# Patient Record
Sex: Male | Born: 1995 | Race: White | Hispanic: No | Marital: Single | Smoking: Current every day smoker
Health system: Southern US, Community
[De-identification: ages and names within clinical notes are randomized; demographics above are authoritative.]

## PROBLEM LIST (undated history)

## (undated) ENCOUNTER — Emergency Department (HOSPITAL_COMMUNITY): Admission: EM | Payer: Managed Care, Other (non HMO) | Source: Home / Self Care

## (undated) DIAGNOSIS — J45909 Unspecified asthma, uncomplicated: Secondary | ICD-10-CM

## (undated) HISTORY — PX: HERNIA REPAIR: SHX51

---
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
## 2014-02-05 ENCOUNTER — Ambulatory Visit (INDEPENDENT_AMBULATORY_CARE_PROVIDER_SITE_OTHER): Payer: BC Managed Care – PPO | Admitting: Internal Medicine

## 2014-02-05 ENCOUNTER — Ambulatory Visit (INDEPENDENT_AMBULATORY_CARE_PROVIDER_SITE_OTHER): Payer: BC Managed Care – PPO

## 2014-02-05 VITALS — BP 140/80 | HR 94 | Temp 98.1°F | Resp 16 | Ht 66.5 in | Wt 175.4 lb

## 2014-02-05 DIAGNOSIS — R079 Chest pain, unspecified: Secondary | ICD-10-CM

## 2014-02-05 DIAGNOSIS — R059 Cough, unspecified: Secondary | ICD-10-CM

## 2014-02-05 DIAGNOSIS — R05 Cough: Secondary | ICD-10-CM

## 2014-02-05 DIAGNOSIS — J45990 Exercise induced bronchospasm: Secondary | ICD-10-CM

## 2014-02-05 DIAGNOSIS — R0602 Shortness of breath: Secondary | ICD-10-CM

## 2014-02-05 LAB — POCT CBC
GRANULOCYTE PERCENT: 67.6 % (ref 37–80)
HEMATOCRIT: 48.7 % (ref 43.5–53.7)
Hemoglobin: 15.8 g/dL (ref 14.1–18.1)
Lymph, poc: 2.1 (ref 0.6–3.4)
MCH: 28.2 pg (ref 27–31.2)
MCHC: 32.5 g/dL (ref 31.8–35.4)
MCV: 86.6 fL (ref 80–97)
MID (CBC): 0.6 (ref 0–0.9)
MPV: 7.7 fL (ref 0–99.8)
POC Granulocyte: 5.6 (ref 2–6.9)
POC LYMPH %: 25.4 % (ref 10–50)
POC MID %: 7 %M (ref 0–12)
Platelet Count, POC: 268 10*3/uL (ref 142–424)
RBC: 5.62 M/uL (ref 4.69–6.13)
RDW, POC: 12.1 %
WBC: 8.3 10*3/uL (ref 4.6–10.2)

## 2014-02-05 MED ORDER — ALBUTEROL SULFATE HFA 108 (90 BASE) MCG/ACT IN AERS: 2.0000 | INHALATION_SPRAY | Freq: Four times a day (QID) | RESPIRATORY_TRACT | Status: DC | PRN

## 2014-02-05 MED ORDER — IBUPROFEN 600 MG PO TABS: 600.0000 mg | ORAL_TABLET | Freq: Three times a day (TID) | ORAL | Status: DC | PRN

## 2014-02-05 NOTE — Progress Notes (Signed)
   Subjective:    Patient ID: David Stark, male    DOB: 12/03/1995, 18 y.o.   MRN: [id number]  HPI 18 year old male. Pt comes in today with complaints of chest and back pain for the last month. Pt states that he cant take a full breath without pain. Pt also has sob. No cough, fever,nausea, vomiting,or chills. Pt smokes but doesn't drink. No hx palpitations or syncope. Father has an electrical problem with his heart. No cough, sneezing, and no hx of allergys. Has a cat, no use of down and has allergy sxs hen around cat. Does feel chest congestion but no sputum and no wheezing and has not used albuterol in haler in weeks. Did not use it when sob this am. Hx asthma no allergys, EIA Review of Systems Father has prolonged QT syndrome working with cardiology    Objective:   Physical Exam  Constitutional: He is oriented to person, place, and time. He appears well-developed and well-nourished. No distress.  HENT:  Head: Normocephalic.  Right Ear: External ear normal.  Left Ear: External ear normal.  Nose: Nose normal.  Mouth/Throat: Oropharynx is clear and moist.  Eyes: Conjunctivae are normal. Pupils are equal, round, and reactive to light.  Neck: Normal range of motion. Neck supple.  Cardiovascular: Normal rate, regular rhythm, normal heart sounds and intact distal pulses.  Exam reveals no gallop and no friction rub.   No murmur heard. Pulmonary/Chest: Effort normal and breath sounds normal. No respiratory distress. He has no wheezes. He has no rales. He exhibits tenderness.  Neurological: He is alert and oriented to person, place, and time. He exhibits normal muscle tone. Coordination normal.  Psychiatric: His speech is normal and behavior is normal. Thought content normal. His mood appears anxious. Cognition and memory are normal.   EKG normal [facility] reading (PRIMARY) by  Dr Perrin Maltese normal cxr no pneumothorax seen  PFR 650 L/min above normal  Results for orders placed in visit on  02/05/14  POCT CBC      Result Value Ref Range   WBC 8.3  4.6 - 10.2 K/uL   Lymph, poc 2.1  0.6 - 3.4   POC LYMPH PERCENT 25.4  10 - 50 %L   MID (cbc) 0.6  0 - 0.9   POC MID % 7.0  0 - 12 %M   POC Granulocyte 5.6  2 - 6.9   Granulocyte percent 67.6  37 - 80 %G   RBC 5.62  4.69 - 6.13 M/uL   Hemoglobin 15.8  14.1 - 18.1 g/dL   HCT, POC 78.2  95.6 - 53.7 %   MCV 86.6  80 - 97 fL   MCH, POC 28.2  27 - 31.2 pg   MCHC 32.5  31.8 - 35.4 g/dL   RDW, POC 21.3     Platelet Count, POC 268  142 - 424 K/uL   MPV 7.7  0 - 99.8 fL         Assessment & Plan:  SOB/CP cause unclear Albuterol/Cat avoidance Trial motrin RTC 3d if not better

## 2014-02-05 NOTE — Patient Instructions (Addendum)
Smoking Cessation Quitting smoking is important to your health and has many advantages. However, it is not always easy to quit since nicotine is a very addictive drug. Oftentimes, people try 3 times or more before being able to quit. This document explains the best ways for you to prepare to quit smoking. Quitting takes hard work and a lot of effort, but you can do it. ADVANTAGES OF QUITTING SMOKING  You will live longer, feel better, and live better.  Your body will feel the impact of quitting smoking almost immediately.  Within 20 minutes, blood pressure decreases. Your pulse returns to its normal level.  After 8 hours, carbon monoxide levels in the blood return to normal. Your oxygen level increases.  After 24 hours, the chance of having a heart attack starts to decrease. Your breath, hair, and body stop smelling like smoke.  After 48 hours, damaged nerve endings begin to recover. Your sense of taste and smell improve.  After 72 hours, the body is virtually free of nicotine. Your bronchial tubes relax and breathing becomes easier.  After 2 to 12 weeks, lungs can hold more air. Exercise becomes easier and circulation improves.  The risk of having a heart attack, stroke, cancer, or lung disease is greatly reduced.  After 1 year, the risk of coronary heart disease is cut in half.  After 5 years, the risk of stroke falls to the same as a nonsmoker.  After 10 years, the risk of lung cancer is cut in half and the risk of other cancers decreases significantly.  After 15 years, the risk of coronary heart disease drops, usually to the level of a nonsmoker.  If you are pregnant, quitting smoking will improve your chances of having a healthy baby.  The people you live with, especially any children, will be healthier.  You will have extra money to spend on things other than cigarettes. QUESTIONS TO THINK ABOUT BEFORE ATTEMPTING TO QUIT You may want to talk about your answers with your  health care provider.  Why do you want to quit?  If you tried to quit in the past, what helped and what did not?  What will be the most difficult situations for you after you quit? How will you plan to handle them?  Who can help you through the tough times? Your family? Friends? A health care provider?  What pleasures do you get from smoking? What ways can you still get pleasure if you quit? Here are some questions to ask your health care provider:  How can you help me to be successful at quitting?  What medicine do you think would be best for me and how should I take it?  What should I do if I need more help?  What is smoking withdrawal like? How can I get information on withdrawal? GET READY  Set a quit date.  Change your environment by getting rid of all cigarettes, ashtrays, matches, and lighters in your home, car, or work. Do not let people smoke in your home.  Review your past attempts to quit. Think about what worked and what did not. GET SUPPORT AND ENCOURAGEMENT You have a better chance of being successful if you have help. You can get support in many ways.  Tell your family, friends, and coworkers that you are going to quit and need their support. Ask them not to smoke around you.  Get individual, group, or telephone counseling and support. Programs are available at local hospitals and health centers. Call   your local health department for information about programs in your area.  Spiritual beliefs and practices may help some smokers quit.  Download a "quit meter" on your computer to keep track of quit statistics, such as how long you have gone without smoking, cigarettes not smoked, and money saved.  Get a self-help book about quitting smoking and staying off tobacco. LEARN NEW SKILLS AND BEHAVIORS  Distract yourself from urges to smoke. Talk to someone, go for a walk, or occupy your time with a task.  Change your normal routine. Take a different route to work.  Drink tea instead of coffee. Eat breakfast in a different place.  Reduce your stress. Take a hot bath, exercise, or read a book.  Plan something enjoyable to do every day. Reward yourself for not smoking.  Explore interactive web-based programs that specialize in helping you quit. GET MEDICINE AND USE IT CORRECTLY Medicines can help you stop smoking and decrease the urge to smoke. Combining medicine with the above behavioral methods and support can greatly increase your chances of successfully quitting smoking.  Nicotine replacement therapy helps deliver nicotine to your body without the negative effects and risks of smoking. Nicotine replacement therapy includes nicotine gum, lozenges, inhalers, nasal sprays, and skin patches. Some may be available over-the-counter and others require a prescription.  Antidepressant medicine helps people abstain from smoking, but how this works is unknown. This medicine is available by prescription.  Nicotinic receptor partial agonist medicine simulates the effect of nicotine in your brain. This medicine is available by prescription. Ask your health care provider for advice about which medicines to use and how to use them based on your health history. Your health care provider will tell you what side effects to look out for if you choose to be on a medicine or therapy. Carefully read the information on the package. Do not use any other product containing nicotine while using a nicotine replacement product.  RELAPSE OR DIFFICULT SITUATIONS Most relapses occur within the first 3 months after quitting. Do not be discouraged if you start smoking again. Remember, most people try several times before finally quitting. You may have symptoms of withdrawal because your body is used to nicotine. You may crave cigarettes, be irritable, feel very hungry, cough often, get headaches, or have difficulty concentrating. The withdrawal symptoms are only temporary. They are strongest  when you first quit, but they will go away within 10-14 days. To reduce the chances of relapse, try to:  Avoid drinking alcohol. Drinking lowers your chances of successfully quitting.  Reduce the amount of caffeine you consume. Once you quit smoking, the amount of caffeine in your body increases and can give you symptoms, such as a rapid heartbeat, sweating, and anxiety.  Avoid smokers because they can make you want to smoke.  Do not let weight gain distract you. Many smokers will gain weight when they quit, usually less than 10 pounds. Eat a healthy diet and stay active. You can always lose the weight gained after you quit.  Find ways to improve your mood other than smoking. FOR MORE INFORMATION  www.smokefree.gov  Document Released: 04/27/2001 Document Revised: 09/17/2013 Document Reviewed: 08/12/2011 [facility] Patient Information 2015 [place], [place]. This information is not intended to replace advice given to you by your health care provider. Make sure you discuss any questions you have with your health care provider. Chest Pain (Nonspecific) It is often hard to give a specific diagnosis for the cause of chest pain. There is always a  chance that your pain could be related to something serious, such as a heart attack or a blood clot in the lungs. You need to follow up with your health care provider for further evaluation. CAUSES   Heartburn.  Pneumonia or bronchitis.  Anxiety or stress.  Inflammation around your heart (pericarditis) or lung (pleuritis or pleurisy).  A blood clot in the lung.  A collapsed lung (pneumothorax). It can develop suddenly on its own (spontaneous pneumothorax) or from trauma to the chest.  Shingles infection (herpes zoster virus). The chest wall is composed of bones, muscles, and cartilage. Any of these can be the source of the pain.  The bones can be bruised by injury.  The muscles or cartilage can be strained by coughing or overwork.  The cartilage  can be affected by inflammation and become sore (costochondritis). DIAGNOSIS  Lab tests or other studies may be needed to find the cause of your pain. Your health care provider may have you take a test called an ambulatory electrocardiogram (ECG). An ECG records your heartbeat patterns over a 24-hour period. You may also have other tests, such as:  Transthoracic echocardiogram (TTE). During echocardiography, sound waves are used to evaluate how blood flows through your heart.  Transesophageal echocardiogram (TEE).  Cardiac monitoring. This allows your health care provider to monitor your heart rate and rhythm in real time.  Holter monitor. This is a portable device that records your heartbeat and can help diagnose heart arrhythmias. It allows your health care provider to track your heart activity for several days, if needed.  Stress tests by exercise or by giving medicine that makes the heart beat faster. TREATMENT   Treatment depends on what may be causing your chest pain. Treatment may include:  Acid blockers for heartburn.  Anti-inflammatory medicine.  Pain medicine for inflammatory conditions.  Antibiotics if an infection is present.  You may be advised to change lifestyle habits. This includes stopping smoking and avoiding alcohol, caffeine, and chocolate.  You may be advised to keep your head raised (elevated) when sleeping. This reduces the chance of acid going backward from your stomach into your esophagus. Most of the time, nonspecific chest pain will improve within 2-3 days with rest and mild pain medicine.  HOME CARE INSTRUCTIONS   If antibiotics were prescribed, take them as directed. Finish them even if you start to feel better.  For the next few days, avoid physical activities that bring on chest pain. Continue physical activities as directed.  Do not use any tobacco products, including cigarettes, chewing tobacco, or electronic cigarettes.  Avoid drinking  alcohol.  Only take medicine as directed by your health care provider.  Follow your health care provider's suggestions for further testing if your chest pain does not go away.  Keep any follow-up appointments you made. If you do not go to an appointment, you could develop lasting (chronic) problems with pain. If there is any problem keeping an appointment, call to reschedule. SEEK MEDICAL CARE IF:   Your chest pain does not go away, even after treatment.  You have a rash with blisters on your chest.  You have a fever. SEEK IMMEDIATE MEDICAL CARE IF:   You have increased chest pain or pain that spreads to your arm, neck, jaw, back, or abdomen.  You have shortness of breath.  You have an increasing cough, or you cough up blood.  You have severe back or abdominal pain.  You feel nauseous or vomit.  You have severe weakness.  You faint.  You have chills. This is an emergency. Do not wait to see if the pain will go away. Get medical help at once. Call your local emergency services (911 in [place] Do not drive yourself to the hospital. MAKE SURE YOU:   Understand these instructions.  Will watch your condition.  Will get help right away if you are not doing well or get worse. Document Released: 02/10/2005 Document Revised: 05/08/2013 Document Reviewed: 12/07/2007 [facility] Patient Information 2015 [place], [place]. This information is not intended to replace advice given to you by your health care provider. Make sure you discuss any questions you have with your health care provider. Asthma Attack Prevention Although there is no way to prevent asthma from starting, you can take steps to control the disease and reduce its symptoms. Learn about your asthma and how to control it. Take an active role to control your asthma by working with your health care provider to create and follow an asthma action plan. An asthma action plan guides you in:  Taking your medicines properly.  Avoiding  things that set off your asthma or make your asthma worse (asthma triggers).  Tracking your level of asthma control.  Responding to worsening asthma.  Seeking emergency care when needed. To track your asthma, keep records of your symptoms, check your peak flow number using a handheld device that shows how well air moves out of your lungs (peak flow meter), and get regular asthma checkups.  WHAT ARE SOME WAYS TO PREVENT AN ASTHMA ATTACK?  Take medicines as directed by your health care provider.  Keep track of your asthma symptoms and level of control.  With your health care provider, write a detailed plan for taking medicines and managing an asthma attack. Then be sure to follow your action plan. Asthma is an ongoing condition that needs regular monitoring and treatment.  Identify and avoid asthma triggers. Many outdoor allergens and irritants (such as pollen, mold, cold air, and air pollution) can trigger asthma attacks. Find out what your asthma triggers are and take steps to avoid them.  Monitor your breathing. Learn to recognize warning signs of an attack, such as coughing, wheezing, or shortness of breath. Your lung function may decrease before you notice any signs or symptoms, so regularly measure and record your peak airflow with a home peak flow meter.  Identify and treat attacks early. If you act quickly, you are less likely to have a severe attack. You will also need less medicine to control your symptoms. When your peak flow measurements decrease and alert you to an upcoming attack, take your medicine as instructed and immediately stop any activity that may have triggered the attack. If your symptoms do not improve, get medical help.  Pay attention to increasing quick-relief inhaler use. If you find yourself relying on your quick-relief inhaler, your asthma is not under control. See your health care provider about adjusting your treatment. WHAT CAN MAKE MY SYMPTOMS WORSE? A number  of common things can set off or make your asthma symptoms worse and cause temporary increased inflammation of your airways. Keep track of your asthma symptoms for several weeks, detailing all the environmental and emotional factors that are linked with your asthma. When you have an asthma attack, go back to your asthma diary to see which factor, or combination of factors, might have contributed to it. Once you know what these factors are, you can take steps to control many of them. If you have allergies and asthma,  it is important to take asthma prevention steps at home. Minimizing contact with the substance to which you are allergic will help prevent an asthma attack. Some triggers and ways to avoid these triggers are: Animal Dander:  Some people are allergic to the flakes of skin or dried saliva from animals with fur or feathers.   There is no such thing as a hypoallergenic dog or cat breed. All dogs or cats can cause allergies, even if they don't shed.  Keep these pets out of your home.  If you are not able to keep a pet outdoors, keep the pet out of your bedroom and other sleeping areas at all times, and keep the door closed.  Remove carpets and furniture covered with cloth from your home. If that is not possible, keep the pet away from fabric-covered furniture and carpets. Dust Mites: Many people with asthma are allergic to dust mites. Dust mites are tiny bugs that are found in every home in mattresses, pillows, carpets, fabric-covered furniture, bedcovers, clothes, stuffed toys, and other fabric-covered items.   Cover your mattress in a special dust-proof cover.  Cover your pillow in a special dust-proof cover, or wash the pillow each week in hot water. Water must be hotter than 130 F (54.4 C) to kill dust mites. Cold or warm water used with detergent and bleach can also be effective.  Wash the sheets and blankets on your bed each week in hot water.  Try not to sleep or lie on  cloth-covered cushions.  Call ahead when traveling and ask for a smoke-free hotel room. Bring your own bedding and pillows in case the hotel only supplies feather pillows and down comforters, which may contain dust mites and cause asthma symptoms.  Remove carpets from your bedroom and those laid on concrete, if you can.  Keep stuffed toys out of the bed, or wash the toys weekly in hot water or cooler water with detergent and bleach. Cockroaches: Many people with asthma are allergic to the droppings and remains of cockroaches.   Keep food and garbage in closed containers. Never leave food out.  Use poison baits, traps, powders, gels, or paste (for example, boric acid).  If a spray is used to kill cockroaches, stay out of the room until the odor goes away. Indoor Mold:  Fix leaky faucets, pipes, or other sources of water that have mold around them.  Clean floors and moldy surfaces with a fungicide or diluted bleach.  Avoid using humidifiers, vaporizers, or swamp coolers. These can spread molds through the air. Pollen and Outdoor Mold:  When pollen or mold spore counts are high, try to keep your windows closed.  Stay indoors with windows closed from late morning to afternoon. Pollen and some mold spore counts are highest at that time.  Ask your health care provider whether you need to take anti-inflammatory medicine or increase your dose of the medicine before your allergy season starts. Other Irritants to Avoid:  Tobacco smoke is an irritant. If you smoke, ask your health care provider how you can quit. Ask family members to quit smoking, too. Do not allow smoking in your home or car.  If possible, do not use a wood-burning stove, kerosene heater, or fireplace. Minimize exposure to all sources of smoke, including incense, candles, fires, and fireworks.  Try to stay away from strong odors and sprays, such as perfume, talcum powder, hair spray, and paints.  Decrease humidity in your  home and use an indoor air cleaning  device. Reduce indoor humidity to below 60%. Dehumidifiers or central air conditioners can do this.  Decrease house dust exposure by changing furnace and air cooler filters frequently.  Try to have someone else vacuum for you once or twice a week. Stay out of rooms while they are being vacuumed and for a short while afterward.  If you vacuum, use a dust mask from a hardware store, a double-layered or microfilter vacuum cleaner bag, or a vacuum cleaner with a HEPA filter.  Sulfites in foods and beverages can be irritants. Do not drink beer or wine or eat dried fruit, processed potatoes, or shrimp if they cause asthma symptoms.  Cold air can trigger an asthma attack. Cover your nose and mouth with a scarf on cold or windy days.  Several health conditions can make asthma more difficult to manage, including a runny nose, sinus infections, reflux disease, psychological stress, and sleep apnea. Work with your health care provider to manage these conditions.  Avoid close contact with people who have a respiratory infection such as a cold or the flu, since your asthma symptoms may get worse if you catch the infection. Wash your hands thoroughly after touching items that may have been handled by people with a respiratory infection.  Get a flu shot every year to protect against the flu virus, which often makes asthma worse for days or weeks. Also get a pneumonia shot if you have not previously had one. Unlike the flu shot, the pneumonia shot does not need to be given yearly. Medicines:  Talk to your health care provider about whether it is safe for you to take aspirin or non-steroidal anti-inflammatory medicines (NSAIDs). In a small number of people with asthma, aspirin and NSAIDs can cause asthma attacks. These medicines must be avoided by people who have known aspirin-sensitive asthma. It is important that people with aspirin-sensitive asthma read labels of all  over-the-counter medicines used to treat pain, colds, coughs, and fever.  Beta-blockers and ACE inhibitors are other medicines you should discuss with your health care provider. HOW CAN I FIND OUT WHAT I AM ALLERGIC TO? Ask your asthma health care provider about allergy skin testing or blood testing (the RAST test) to identify the allergens to which you are sensitive. If you are found to have allergies, the most important thing to do is to try to avoid exposure to any allergens that you are sensitive to as much as possible. Other treatments for allergies, such as medicines and allergy shots (immunotherapy) are available.  CAN I EXERCISE? Follow your health care provider's advice regarding asthma treatment before exercising. It is important to maintain a regular exercise program, but vigorous exercise or exercise in cold, humid, or dry environments can cause asthma attacks, especially for those people who have exercise-induced asthma. Document Released: 04/21/2009 Document Revised: 05/08/2013 Document Reviewed: 11/08/2012 [facility] Patient Information 2015 [place], [place]. This information is not intended to replace advice given to you by your health care provider. Make sure you discuss any questions you have with your health care provider.

## 2014-05-28 ENCOUNTER — Ambulatory Visit: Payer: Self-pay

## 2014-05-28 ENCOUNTER — Other Ambulatory Visit: Payer: Self-pay | Admitting: Occupational Medicine

## 2014-05-28 DIAGNOSIS — Z0000 Encounter for general adult medical examination without abnormal findings: Secondary | ICD-10-CM

## 2017-02-01 ENCOUNTER — Ambulatory Visit (INDEPENDENT_AMBULATORY_CARE_PROVIDER_SITE_OTHER): Payer: Managed Care, Other (non HMO) | Admitting: Physician Assistant

## 2017-02-01 ENCOUNTER — Encounter: Payer: Self-pay | Admitting: Physician Assistant

## 2017-02-01 VITALS — BP 106/69 | HR 80 | Temp 98.3°F | Resp 16 | Ht 66.5 in | Wt 181.4 lb

## 2017-02-01 DIAGNOSIS — H00012 Hordeolum externum right lower eyelid: Secondary | ICD-10-CM

## 2017-02-01 MED ORDER — ERYTHROMYCIN 5 MG/GM OP OINT: 1.0000 "application " | TOPICAL_OINTMENT | Freq: Four times a day (QID) | OPHTHALMIC | 0 refills | Status: AC

## 2017-02-01 NOTE — Progress Notes (Signed)
PRIMARY CARE AT [facility] [address] [place] [address] [phone]  Date:  02/01/2017   Name:  David Stark   DOB:  [birth year]   MRN:  [id number]  PCP:  Jonita Albee, MD    History of Present Illness:  David Stark is a 21 y.o. male patient who presents to PCP with  Chief Complaint  Patient presents with  . Belepharitis    small bump on L right eye, itching and burning x several days. Patient denies blurred or affected vision     --patient states 1 week of an red bump on his bottom left eye lid that is pruritic and burning.  He has no added discharge of the eye.  No change in vision or foreign body.  No pain with eye movement.   Patient Active Problem List   Diagnosis Date Noted  . Asthma 01/14/2012    Past Medical History:  Diagnosis Date  . Asthma     Past Surgical History:  Procedure Laterality Date  . HERNIA REPAIR      Social History  Substance Use Topics  . Smoking status: Current Every Day Smoker  . Smokeless tobacco: Never Used  . Alcohol use No    No family history on file.  Allergies  Allergen Reactions  . Amoxicillin-Pot Clavulanate Hives    Medication list has been reviewed and updated.  Current Outpatient Prescriptions on File Prior to Visit  Medication Sig Dispense Refill  . albuterol (PROVENTIL HFA;VENTOLIN HFA) 108 (90 BASE) MCG/ACT inhaler Inhale 2 puffs into the lungs every 4 (four) hours as needed for wheezing. 1 Inhaler 0  . albuterol (PROVENTIL HFA;VENTOLIN HFA) 108 (90 BASE) MCG/ACT inhaler Inhale 2 puffs into the lungs every 6 (six) hours as needed for wheezing or shortness of breath. (Patient not taking: Reported on 02/01/2017) 1 Inhaler 3  . beclomethasone (QVAR) 40 MCG/ACT inhaler Inhale 2 puffs into the lungs 2 (two) times daily. (Patient not taking: Reported on 02/01/2017) 1 Inhaler 0  . benzonatate (TESSALON) 100 MG capsule Take 1-2 capsules (100-200 mg total) by mouth 3 (three) times daily as needed for cough.  (Patient not taking: Reported on 02/01/2017) 40 capsule 0  . Guaifenesin (MUCINEX MAXIMUM STRENGTH) 1200 MG TB12 Take 1 tablet (1,200 mg total) by mouth every 12 (twelve) hours as needed. (Patient not taking: Reported on 02/01/2017) 14 tablet 1  . ibuprofen (ADVIL,MOTRIN) 200 MG tablet Take 200 mg by mouth every 6 (six) hours as needed.    Marland Kitchen ibuprofen (ADVIL,MOTRIN) 600 MG tablet Take 1 tablet (600 mg total) by mouth every 8 (eight) hours as needed. (Patient not taking: Reported on 02/01/2017) 30 tablet 0  . ipratropium (ATROVENT) 0.03 % nasal spray Place 2 sprays into the nose 2 (two) times daily. (Patient not taking: Reported on 02/01/2017) 30 mL 0  . metroNIDAZOLE (FLAGYL) 500 MG tablet Take 1 tablet (500 mg total) by mouth 3 (three) times daily. Do not drink alcohol while on this (Patient not taking: Reported on 02/01/2017) 15 tablet 0  . sulfamethoxazole-trimethoprim (BACTRIM DS) 800-160 MG per tablet Take 1 tablet by mouth 2 (two) times daily. (Patient not taking: Reported on 02/01/2017) 10 tablet 0   No current facility-administered medications on file prior to visit.     ROS ROS otherwise unremarkable unless listed above.  Physical Examination: BP 106/69 (BP Location: Right Arm, Patient Position: Sitting, Cuff Size: Normal)   Pulse 80   Temp 98.3 F (36.8 C) (Oral)  Resp 16   Ht 5' 6.5" (1.689 m)   Wt 181 lb 6.4 oz (82.3 kg)   SpO2 96%   BMI 28.84 kg/m  Ideal Body Weight: Weight in (lb) to have BMI = 25: 156.9  Physical Exam  Constitutional: He is oriented to person, place, and time. He appears well-developed and well-nourished. No distress.  HENT:  Head: Normocephalic and atraumatic.  Eyes: Pupils are equal, round, and reactive to light. Conjunctivae and EOM are normal.  Right eyelid with erythematous superfical swollen small nodule that is mildly tender at the lower medial eyelid.  The duct can be seen and non-erythematous.  No punctate sign for the swelling when lid is everted.     Cardiovascular: Normal rate.   Pulmonary/Chest: Effort normal. No respiratory distress.  Neurological: He is alert and oriented to person, place, and time.  Skin: Skin is warm and dry. He is not diaphoretic.  Psychiatric: He has a normal mood and affect. His behavior is normal.     Assessment and Plan: David Stark is a 21 y.o. male who is here today for cc of  Chief Complaint  Patient presents with  . Belepharitis    small bump on L right eye, itching and burning x several days. Patient denies blurred or affected vision   --warm compresses advised. --rtc in 2 weeks if sxs have not improved.  Hordeolum externum of right lower eyelid  Trena Platt, PA-C [facility] 9/23/20184:16 PM

## 2017-02-01 NOTE — Patient Instructions (Signed)
You can use ibuprofen for pain as well.    Stye A stye is a bump on your eyelid caused by a bacterial infection. A stye can form inside the eyelid (internal stye) or outside the eyelid (external stye). An internal stye may be caused by an infected oil-producing gland inside your eyelid. An external stye may be caused by an infection at the base of your eyelash (hair follicle). Styes are very common. Anyone can get them at any age. They usually occur in just one eye, but you may have more than one in either eye. What are the causes? The infection is almost always caused by bacteria called Staphylococcus aureus. This is a common type of bacteria that lives on your skin. What increases the risk? You may be at higher risk for a stye if you have had one before. You may also be at higher risk if you have:  Diabetes.  Long-term illness.  Long-term eye redness.  A skin condition called seborrhea.  High fat levels in your blood (lipids).  What are the signs or symptoms? Eyelid pain is the most common symptom of a stye. Internal styes are more painful than external styes. Other signs and symptoms may include:  Painful swelling of your eyelid.  A scratchy feeling in your eye.  Tearing and redness of your eye.  Pus draining from the stye.  How is this diagnosed? Your health care provider may be able to diagnose a stye just by examining your eye. The health care provider may also check to make sure:  You do not have a fever or other signs of a more serious infection.  The infection has not spread to other parts of your eye or areas around your eye.  How is this treated? Most styes will clear up in a few days without treatment. In some cases, you may need to use antibiotic drops or ointment to prevent infection. Your health care provider may have to drain the stye surgically if your stye is:  Large.  Causing a lot of pain.  Interfering with your vision.  This can be done using a thin  blade or a needle. Follow these instructions at home:  Take medicines only as directed by your health care provider.  Apply a clean, warm compress to your eye for 10 minutes, 4 times a day.  Do not wear contact lenses or eye makeup until your stye has healed.  Do not try to pop or drain the stye. Contact a health care provider if:  You have chills or a fever.  Your stye does not go away after several days.  Your stye affects your vision.  Your eyeball becomes swollen, red, or painful. This information is not intended to replace advice given to you by your health care provider. Make sure you discuss any questions you have with your health care provider. Document Released: 02/10/2005 Document Revised: 12/28/2015 Document Reviewed: 08/17/2013 Elsevier Interactive Patient Education  [employer].

## 2017-03-19 ENCOUNTER — Emergency Department (HOSPITAL_COMMUNITY): Payer: Managed Care, Other (non HMO)

## 2017-03-19 ENCOUNTER — Emergency Department (HOSPITAL_COMMUNITY)
Admission: EM | Admit: 2017-03-19 | Discharge: 2017-03-19 | Disposition: A | Discharge status: Home / Self Care | Payer: Managed Care, Other (non HMO) | Attending: Emergency Medicine | Admitting: Emergency Medicine

## 2017-03-19 ENCOUNTER — Encounter (HOSPITAL_COMMUNITY): Payer: Self-pay | Admitting: *Deleted

## 2017-03-19 DIAGNOSIS — J4521 Mild intermittent asthma with (acute) exacerbation: Secondary | ICD-10-CM | POA: Insufficient documentation

## 2017-03-19 DIAGNOSIS — F17210 Nicotine dependence, cigarettes, uncomplicated: Secondary | ICD-10-CM | POA: Insufficient documentation

## 2017-03-19 DIAGNOSIS — R0602 Shortness of breath: Secondary | ICD-10-CM | POA: Diagnosis present

## 2017-03-19 DIAGNOSIS — Z791 Long term (current) use of non-steroidal anti-inflammatories (NSAID): Secondary | ICD-10-CM | POA: Insufficient documentation

## 2017-03-19 DIAGNOSIS — Z79899 Other long term (current) drug therapy: Secondary | ICD-10-CM | POA: Insufficient documentation

## 2017-03-19 MED ORDER — IPRATROPIUM-ALBUTEROL 0.5-2.5 (3) MG/3ML IN SOLN
3.0000 mL | Freq: Once | RESPIRATORY_TRACT | Status: AC
  Administered 2017-03-19: 3 mL via RESPIRATORY_TRACT
  Filled 2017-03-19: qty 3

## 2017-03-19 MED ORDER — PREDNISONE 20 MG PO TABS: 40.0000 mg | ORAL_TABLET | Freq: Every day | ORAL | 0 refills | Status: AC

## 2017-03-19 MED ORDER — ALBUTEROL SULFATE HFA 108 (90 BASE) MCG/ACT IN AERS: 1.0000 | INHALATION_SPRAY | Freq: Four times a day (QID) | RESPIRATORY_TRACT | 0 refills | Status: AC | PRN

## 2017-03-19 MED ORDER — PREDNISONE 20 MG PO TABS
40.0000 mg | ORAL_TABLET | Freq: Every day | ORAL | Status: DC
  Administered 2017-03-19: 40 mg via ORAL
  Filled 2017-03-19: qty 2

## 2017-03-19 NOTE — ED Notes (Signed)
Patient denies pain and is resting comfortably.  

## 2017-03-19 NOTE — ED Notes (Signed)
Pt transported to xray via wheel chair

## 2017-03-19 NOTE — ED Provider Notes (Signed)
[facility] Provider Note   CSN: [id number] Arrival date & time: 03/19/17  1913     History   Chief Complaint Chief Complaint  Patient presents with  . Asthma    HPI David Stark is a 21 y.o. male with a history of asthma who presents today for evaluation of difficulty breathing/shortness of breath for the past several days.  He reports that he used to smoke, however he stopped smoking about 3 months ago.  He reports that he has been feeling minimal chest tightness that comes about every 3 hours and last 1-2 seconds.  He denies any diaphoresis, N/V during this.  He reports that he has not used an inhaler.  He does also reports that he has a history of eczema.  He also has nasal congestion, and some mild postnasal drip.  He reports that he works around dust and wears a mask sometimes.  HPI  Past Medical History:  Diagnosis Date  . Asthma     Patient Active Problem List   Diagnosis Date Noted  . Asthma 01/14/2012    Past Surgical History:  Procedure Laterality Date  . HERNIA REPAIR         Home Medications    Prior to Admission medications   Medication Sig Start Date End Date Taking? Authorizing Provider  Aspirin-Acetaminophen-Caffeine (HEADACHE FORMULA PO) Take 1 application by mouth as needed. [place]   Yes [provider]  albuterol (PROVENTIL HFA;VENTOLIN HFA) 108 (90 Base) MCG/ACT inhaler Inhale 1-2 puffs into the lungs every 6 (six) hours as needed for wheezing or shortness of breath. 03/19/17   Cristina GongHammond, Rossetta Kama W, PA-C  beclomethasone (QVAR) 40 MCG/ACT inhaler Inhale 2 puffs into the lungs 2 (two) times daily. Patient not taking: Reported on 02/01/2017 02/25/12   Porfirio OarJeffery, Chelle, PA-C  benzonatate (TESSALON) 100 MG capsule Take 1-2 capsules (100-200 mg total) by mouth 3 (three) times daily as needed for cough. Patient not taking: Reported on 02/01/2017 02/25/12   Porfirio OarJeffery, Chelle, PA-C  Guaifenesin [facility](MUCINEX  MAXIMUM STRENGTH) 1200 MG TB12 Take 1 tablet (1,200 mg total) by mouth every 12 (twelve) hours as needed. Patient not taking: Reported on 02/01/2017 02/25/12   Porfirio OarJeffery, Chelle, PA-C  ibuprofen (ADVIL,MOTRIN) 200 MG tablet Take 200 mg by mouth every 6 (six) hours as needed.    [provider]  ibuprofen (ADVIL,MOTRIN) 600 MG tablet Take 1 tablet (600 mg total) by mouth every 8 (eight) hours as needed. Patient not taking: Reported on 02/01/2017 02/05/14   Jonita AlbeeGuest, Chris W, MD  ipratropium (ATROVENT) 0.03 % nasal spray Place 2 sprays into the nose 2 (two) times daily. Patient not taking: Reported on 02/01/2017 02/25/12   Porfirio OarJeffery, Chelle, PA-C  metroNIDAZOLE (FLAGYL) 500 MG tablet Take 1 tablet (500 mg total) by mouth 3 (three) times daily. Do not drink alcohol while on this Patient not taking: Reported on 02/01/2017 08/19/13   Tonye Pearsonoolittle, Robert P, MD  predniSONE (DELTASONE) 20 MG tablet Take 2 tablets (40 mg total) by mouth daily. 03/19/17 03/23/17  Cristina GongHammond, Lucely Leard W, PA-C  sulfamethoxazole-trimethoprim (BACTRIM DS) 800-160 MG per tablet Take 1 tablet by mouth 2 (two) times daily. Patient not taking: Reported on 02/01/2017 08/19/13   Tonye Pearsonoolittle, Robert P, MD    Family History No family history on file.  Social History Social History  Substance Use Topics  . Smoking status: Current Every Day Smoker  . Smokeless tobacco: Never Used  . Alcohol use No     Allergies  Amoxicillin-pot clavulanate   Review of Systems Review of Systems  Constitutional: Negative for chills and fever.  HENT: Negative for ear pain and sore throat.   Eyes: Negative for pain and visual disturbance.  Respiratory: Positive for chest tightness (Around once every 3 hours, only lasts 1-2 seconds.) and shortness of breath. Negative for cough.   Cardiovascular: Negative for chest pain and palpitations.  Gastrointestinal: Negative for abdominal pain and vomiting.  Genitourinary: Negative for dysuria and hematuria.    Musculoskeletal: Negative for arthralgias and back pain.  Skin: Negative for color change and rash.  Neurological: Negative for seizures and syncope.  All other systems reviewed and are negative.    Physical Exam Updated Vital Signs BP 130/74   Pulse 78   Temp 98.2 F (36.8 C) (Oral)   Resp 18   Ht 5\' 6"  (1.676 m)   Wt 81.6 kg (180 lb)   SpO2 99%   BMI 29.05 kg/m   Physical Exam  Constitutional: He appears well-developed and well-nourished.  HENT:  Head: Normocephalic and atraumatic.  Eyes: Conjunctivae are normal.  Neck: Neck supple.  Cardiovascular: Normal rate, regular rhythm, normal heart sounds and intact distal pulses.   No murmur heard. Pulmonary/Chest: Effort normal. No stridor. No respiratory distress. He has wheezes (Subtle diffuse bilateral wheezes. ). He exhibits no tenderness.  Abdominal: Soft. There is no tenderness.  Musculoskeletal: He exhibits no edema.  Neurological: He is alert.  Skin: Skin is warm and dry.  Psychiatric: He has a normal mood and affect. His behavior is normal.  Nursing note and vitals reviewed.    ED Treatments / Results  Labs (all labs ordered are listed, but only abnormal results are displayed) Labs Reviewed - No data to display  EKG  EKG Interpretation None       Radiology Dg Chest 2 View  Result Date: 03/19/2017 CLINICAL DATA:  History of asthma.  Trouble breathing.  Cough. EXAM: CHEST  2 VIEW COMPARISON:  May 28, 2014 FINDINGS: The heart size and mediastinal contours are within normal limits. Both lungs are clear. The visualized skeletal structures are unremarkable. IMPRESSION: No active cardiopulmonary disease. Electronically Signed   By: Gerome Sam III M.D   On: 03/19/2017 19:55    Procedures Procedures (including critical care time)  Medications Ordered in ED Medications  predniSONE (DELTASONE) tablet 40 mg (40 mg Oral Given 03/19/17 2147)  ipratropium-albuterol (DUONEB) 0.5-2.5 (3) MG/3ML nebulizer  solution 3 mL (3 mLs Nebulization Given 03/19/17 2147)     Initial Impression / Assessment and Plan / ED Course  I have reviewed the triage vital signs and the nursing notes.  Pertinent labs & imaging results that were available during my care of the patient were reviewed by me and considered in my medical decision making (see chart for details).  Clinical Course as of Mar 20 53  Sat Mar 19, 2017  2204 Evaluated, lung sounds are clear to auscultation bilaterally.  He reports that he feels much better and is ready to go home.  He was given return precautions, and states his understanding.  [EH]    Clinical Course User Index [EH] Cristina Gong, PA-C   David Docker presents for evaluation of three days of shortness of breath.  He has a history of asthma, however has not used an inhaler in many years.  He was treated with a duo-neb which improved his symptoms.  He is PERC negative.  He does have intermittent twinges of chest tightness that last  for a few seconds before resolving.  EKG obtained with out obvious abnormality.  CXR obtained with no pneumonia or other abnormality.  Will dc with prednisone burst and albuterol inhaler.  He was given return precautions and states understanding.  He was instructed to start taking an allergy medicine to help with his nasal congestion.  Hemodynamically stable, safe for discharge.   Final Clinical Impressions(s) / ED Diagnoses   Final diagnoses:  Mild intermittent asthma with exacerbation    New Prescriptions Discharge Medication List as of 03/19/2017 10:09 PM    START taking these medications   Details  predniSONE (DELTASONE) 20 MG tablet Take 2 tablets (40 mg total) by mouth daily., Starting Sat 03/19/2017, Until Wed 03/23/2017, Print         Cristina Gong, PA-C 03/20/17 1610    Margarita Grizzle, MD 03/20/17 2044

## 2017-03-19 NOTE — ED Triage Notes (Signed)
The pt has asthma  He has had some difficulty for the past several days.  He has not used an inhaler for years.  He stopped smoking 3 months ago  No audible wheezes no difficulty brerathing

## 2017-03-19 NOTE — Discharge Instructions (Signed)
Please obtain a primary care provider and follow-up with them. Please consider taking a daily allergy medication to help with your symptoms.  I suggest a less drowsy 24 hour medication such as allegra, zyrtec or Claritin or the generic version.

## 2017-08-15 ENCOUNTER — Encounter: Payer: Self-pay | Admitting: Physician Assistant

## 2017-09-19 ENCOUNTER — Emergency Department (HOSPITAL_COMMUNITY): Payer: Managed Care, Other (non HMO)

## 2017-09-19 ENCOUNTER — Encounter (HOSPITAL_COMMUNITY): Payer: Self-pay | Admitting: Emergency Medicine

## 2017-09-19 ENCOUNTER — Emergency Department (HOSPITAL_COMMUNITY)
Admission: EM | Admit: 2017-09-19 | Discharge: 2017-09-20 | Disposition: A | Discharge status: Home / Self Care | Payer: Managed Care, Other (non HMO) | Attending: Emergency Medicine | Admitting: Emergency Medicine

## 2017-09-19 DIAGNOSIS — Y998 Other external cause status: Secondary | ICD-10-CM | POA: Diagnosis not present

## 2017-09-19 DIAGNOSIS — R55 Syncope and collapse: Secondary | ICD-10-CM | POA: Diagnosis not present

## 2017-09-19 DIAGNOSIS — M79671 Pain in right foot: Secondary | ICD-10-CM | POA: Diagnosis not present

## 2017-09-19 DIAGNOSIS — Z23 Encounter for immunization: Secondary | ICD-10-CM | POA: Diagnosis not present

## 2017-09-19 DIAGNOSIS — Y9355 Activity, bike riding: Secondary | ICD-10-CM | POA: Insufficient documentation

## 2017-09-19 DIAGNOSIS — M25561 Pain in right knee: Secondary | ICD-10-CM | POA: Insufficient documentation

## 2017-09-19 DIAGNOSIS — M25562 Pain in left knee: Secondary | ICD-10-CM | POA: Insufficient documentation

## 2017-09-19 DIAGNOSIS — S63094A Other dislocation of right wrist and hand, initial encounter: Secondary | ICD-10-CM

## 2017-09-19 DIAGNOSIS — J45909 Unspecified asthma, uncomplicated: Secondary | ICD-10-CM | POA: Diagnosis not present

## 2017-09-19 DIAGNOSIS — Y929 Unspecified place or not applicable: Secondary | ICD-10-CM | POA: Insufficient documentation

## 2017-09-19 DIAGNOSIS — F17200 Nicotine dependence, unspecified, uncomplicated: Secondary | ICD-10-CM | POA: Insufficient documentation

## 2017-09-19 DIAGNOSIS — S99101A Unspecified physeal fracture of right metatarsal, initial encounter for closed fracture: Secondary | ICD-10-CM | POA: Diagnosis not present

## 2017-09-19 DIAGNOSIS — S6991XA Unspecified injury of right wrist, hand and finger(s), initial encounter: Secondary | ICD-10-CM | POA: Diagnosis present

## 2017-09-19 HISTORY — DX: Unspecified asthma, uncomplicated: J45.909

## 2017-09-19 LAB — I-STAT CHEM 8, ED
BUN: 11 mg/dL (ref 6–20)
Calcium, Ion: 1.02 mmol/L — ABNORMAL LOW (ref 1.15–1.40)
Chloride: 106 mmol/L (ref 101–111)
Creatinine, Ser: 0.9 mg/dL (ref 0.61–1.24)
Glucose, Bld: 105 mg/dL — ABNORMAL HIGH (ref 65–99)
HEMATOCRIT: 46 % (ref 39.0–52.0)
HEMOGLOBIN: 15.6 g/dL (ref 13.0–17.0)
Potassium: 4.3 mmol/L (ref 3.5–5.1)
SODIUM: 142 mmol/L (ref 135–145)
TCO2: 20 mmol/L — ABNORMAL LOW (ref 22–32)

## 2017-09-19 LAB — COMPREHENSIVE METABOLIC PANEL
ALBUMIN: 4.5 g/dL (ref 3.5–5.0)
ALT: 20 U/L (ref 17–63)
AST: 55 U/L — ABNORMAL HIGH (ref 15–41)
Alkaline Phosphatase: 82 U/L (ref 38–126)
Anion gap: 13 (ref 5–15)
BUN: 9 mg/dL (ref 6–20)
CALCIUM: 9.1 mg/dL (ref 8.9–10.3)
CHLORIDE: 108 mmol/L (ref 101–111)
CO2: 19 mmol/L — AB (ref 22–32)
CREATININE: 0.97 mg/dL (ref 0.61–1.24)
GFR calc non Af Amer: >60 mL/min (ref >60)
GLUCOSE: 100 mg/dL — AB (ref 65–99)
Potassium: 4.3 mmol/L (ref 3.5–5.1)
SODIUM: 140 mmol/L (ref 135–145)
Total Bilirubin: 0.8 mg/dL (ref 0.3–1.2)
Total Protein: 7 g/dL (ref 6.5–8.1)

## 2017-09-19 LAB — PROTIME-INR
INR: 1.08
Prothrombin Time: 13.9 seconds (ref 11.4–15.2)

## 2017-09-19 LAB — I-STAT CG4 LACTIC ACID, ED: Lactic Acid, Venous: 3.65 mmol/L (ref 0.5–1.9)

## 2017-09-19 LAB — SAMPLE TO BLOOD BANK

## 2017-09-19 LAB — ETHANOL: ALCOHOL ETHYL (B): 24 mg/dL — AB (ref <10)

## 2017-09-19 LAB — CBC
HCT: 45.5 % (ref 39.0–52.0)
HEMOGLOBIN: 16 g/dL (ref 13.0–17.0)
MCH: 29.6 pg (ref 26.0–34.0)
MCHC: 35.2 g/dL (ref 30.0–36.0)
MCV: 84.3 fL (ref 78.0–100.0)
Platelets: 292 10*3/uL (ref 150–400)
RBC: 5.4 MIL/uL (ref 4.22–5.81)
RDW: 12.1 % (ref 11.5–15.5)
WBC: 13.4 10*3/uL — ABNORMAL HIGH (ref 4.0–10.5)

## 2017-09-19 LAB — CDS SEROLOGY

## 2017-09-19 MED ORDER — OXYCODONE-ACETAMINOPHEN 5-325 MG PO TABS
1.0000 | ORAL_TABLET | Freq: Once | ORAL | Status: AC
  Administered 2017-09-19: 1 via ORAL
  Filled 2017-09-19: qty 1

## 2017-09-19 MED ORDER — TETANUS-DIPHTH-ACELL PERTUSSIS 5-2.5-18.5 LF-MCG/0.5 IM SUSP
0.5000 mL | Freq: Once | INTRAMUSCULAR | Status: AC
  Administered 2017-09-19: 0.5 mL via INTRAMUSCULAR
  Filled 2017-09-19: qty 0.5

## 2017-09-19 MED ORDER — IOHEXOL 300 MG/ML  SOLN
100.0000 mL | Freq: Once | INTRAMUSCULAR | Status: AC | PRN
  Administered 2017-09-19: 100 mL via INTRAVENOUS
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
## 2017-09-19 NOTE — ED Triage Notes (Signed)
Pt placed in c-collar
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
## 2017-09-20 ENCOUNTER — Encounter (HOSPITAL_COMMUNITY): Payer: Self-pay | Admitting: *Deleted

## 2017-09-20 DIAGNOSIS — S63094A Other dislocation of right wrist and hand, initial encounter: Secondary | ICD-10-CM | POA: Diagnosis not present

## 2017-09-20 LAB — URINALYSIS, ROUTINE W REFLEX MICROSCOPIC
BACTERIA UA: NONE SEEN
BILIRUBIN URINE: NEGATIVE
Glucose, UA: NEGATIVE mg/dL
KETONES UR: NEGATIVE mg/dL
Leukocytes, UA: NEGATIVE
NITRITE: NEGATIVE
PH: 5 (ref 5.0–8.0)
Protein, ur: NEGATIVE mg/dL
Specific Gravity, Urine: 1.024 (ref 1.005–1.030)

## 2017-09-20 SURGERY — Surgical Case
Anesthesia: *Unknown

## 2017-09-20 MED ORDER — IBUPROFEN 200 MG PO TABS: 600.0000 mg | ORAL_TABLET | Freq: Four times a day (QID) | ORAL | Status: DC

## 2017-09-20 MED ORDER — OXYCODONE HCL 5 MG PO TABS: 5.0000 mg | ORAL_TABLET | Freq: Four times a day (QID) | ORAL | 0 refills | Status: DC | PRN

## 2017-09-20 MED ORDER — ACETAMINOPHEN 325 MG PO TABS: 650.0000 mg | ORAL_TABLET | Freq: Four times a day (QID) | ORAL | Status: DC

## 2017-10-25 ENCOUNTER — Ambulatory Visit (INDEPENDENT_AMBULATORY_CARE_PROVIDER_SITE_OTHER): Payer: Managed Care, Other (non HMO)

## 2017-10-25 ENCOUNTER — Ambulatory Visit: Payer: Managed Care, Other (non HMO) | Admitting: Emergency Medicine

## 2017-10-25 ENCOUNTER — Encounter: Payer: Self-pay | Admitting: Emergency Medicine

## 2017-10-25 VITALS — BP 168/74 | HR 82 | Temp 98.0°F | Resp 17 | Ht 68.0 in | Wt 181.0 lb

## 2017-10-25 DIAGNOSIS — S92901A Unspecified fracture of right foot, initial encounter for closed fracture: Secondary | ICD-10-CM | POA: Insufficient documentation

## 2017-10-25 DIAGNOSIS — S92901S Unspecified fracture of right foot, sequela: Secondary | ICD-10-CM
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
## 2017-10-25 NOTE — Patient Instructions (Addendum)
   IF you received an x-ray today, you will receive an invoice from [facility]. Please contact [facility] at [phone] with questions or concerns regarding your invoice.   IF you received labwork today, you will receive an invoice from [facility]. Please contact [facility] at [phone] with questions or concerns regarding your invoice.   Our billing staff will not be able to assist you with questions regarding bills from these companies.  You will be contacted with the lab results as soon as they are available. The fastest way to get your results is to activate your My Chart account. Instructions are located on the last page of this paperwork. If you have not heard from us regarding the results in 2 weeks, please contact this office.     Metatarsal Fracture A metatarsal fracture is a broken bone in one of the five bones that connect your toes to the rest of your foot (forefoot fracture). Metatarsals are long bones that can be stressed or cracked easily. A metatarsal fracture can be:  A stress fracture. Stress fractures are cracks in the surface of the metatarsal bone. Athletes often get stress fractures.  A complete fracture. A complete fracture goes all the way through the bone.  The bone that connects to the pinky toe (fifth metatarsal) is the most commonly fractured metatarsal. Ballet dancers often fracture this bone. What are the causes? This type of fracture may be caused by:  A sudden twisting of your foot.  A fall onto your foot.  Overuse or repetitive exercise.  What increases the risk? This condition is more likely to develop in people who:  Play contact sports.  Have a bone disease.  Have a low calcium level.  What are the signs or symptoms? Symptoms of this condition include:  Pain that is worse when walking or standing.  Pain when pressing on the foot or moving the toes.  Swelling.  Bruising on the top or bottom of the foot.  A  foot that appears shorter than the other one.  How is this diagnosed? This condition is diagnosed with a physical exam. You may also have imaging tests, such as:  X-rays.  A CT scan.  MRI.  How is this treated? Treatment for this condition depends on its severity and whether a bone has moved out of place. Treatment may involve:  Rest.  Wearing foot support such as a cast, splint, or boot for several weeks.  Using crutches.  Surgery to move bones back into the right position. Surgery is usually needed if there are many pieces of broken bone or bones that are very out of place (displaced fracture).  Physical therapy. This may be needed to help you regain full movement and strength in your foot.  You will need to return to your health care provider to have X-rays taken until your bones heal. Your health care provider will look at the X-rays to make sure that your foot is healing well. Follow these instructions at home: If you have a cast:  Do not stick anything inside the cast to scratch your skin. Doing that increases your risk of infection.  Check the skin around the cast every day. Report any concerns to your health care provider. You may put lotion on dry skin around the edges of the cast. Do not apply lotion to the skin underneath the cast.  Keep the cast clean and dry. If you have a splint or a supportive boot:  Wear it as directed   directed by your health care provider. Remove it only as directed by your health care provider.  Loosen it if your toes become numb and tingle, or if they turn cold and blue.  Keep it clean and dry. Bathing  Do not take baths, swim, or use a hot tub until your health care provider approves. Ask your health care provider if you can take showers. You may only be allowed to take sponge baths for bathing.  If your health care provider approves bathing and showering, cover the cast or splint with a watertight plastic bag to protect it from water. Do not  let the cast or splint get wet. Managing pain, stiffness, and swelling  If directed, apply ice to the injured area (if you have a splint, not a cast). ? Put ice in a plastic bag. ? Place a towel between your skin and the bag. ? Leave the ice on for 20 minutes, 2-3 times per day.  Move your toes often to avoid stiffness and to lessen swelling.  Raise (elevate) the injured area above the level of your heart while you are sitting or lying down. Driving  Do not drive or operate heavy machinery while taking pain medicine.  Do not drive while wearing foot support on a foot that you use for driving. Activity  Return to your normal activities as directed by your health care provider. Ask your health care provider what activities are safe for you.  Perform exercises as directed by your health care provider or physical therapist. Safety  Do not use the injured foot to support your body weight until your health care provider says that you can. Use crutches as directed by your health care provider. General instructions  Do not put pressure on any part of the cast or splint until it is fully hardened. This may take several hours.  Do not use any tobacco products, including cigarettes, chewing tobacco, or e-cigarettes. Tobacco can delay bone healing. If you need help quitting, ask your health care provider.  Take medicines only as directed by your health care provider.  Keep all follow-up visits as directed by your health care provider. This is important. Contact a health care provider if:  You have a fever.  Your cast, splint, or boot is too loose or too tight.  Your cast, splint, or boot is damaged.  Your pain medicine is not helping.  You have pain, tingling, or numbness in your foot that is not going away. Get help right away if:  You have severe pain.  You have tingling or numbness in your foot that is getting worse.  Your foot feels cold or becomes numb.  Your foot changes  color. This information is not intended to replace advice given to you by your health care provider. Make sure you discuss any questions you have with your health care provider. Document Released: 01/23/2002 Document Revised: 01/06/2016 Document Reviewed: 02/27/2014 Elsevier Interactive Patient Education  [employer]2018 [employer] Inc.

## 2017-11-05 IMAGING — CR DG CHEST 2V
2 series · 2 of 2 positions shown · non-contrast
Comparison: May 28, 2014

CLINICAL DATA: History of asthma.  Trouble breathing.  Cough.

EXAM:
CHEST  2 VIEW

[chest pa]
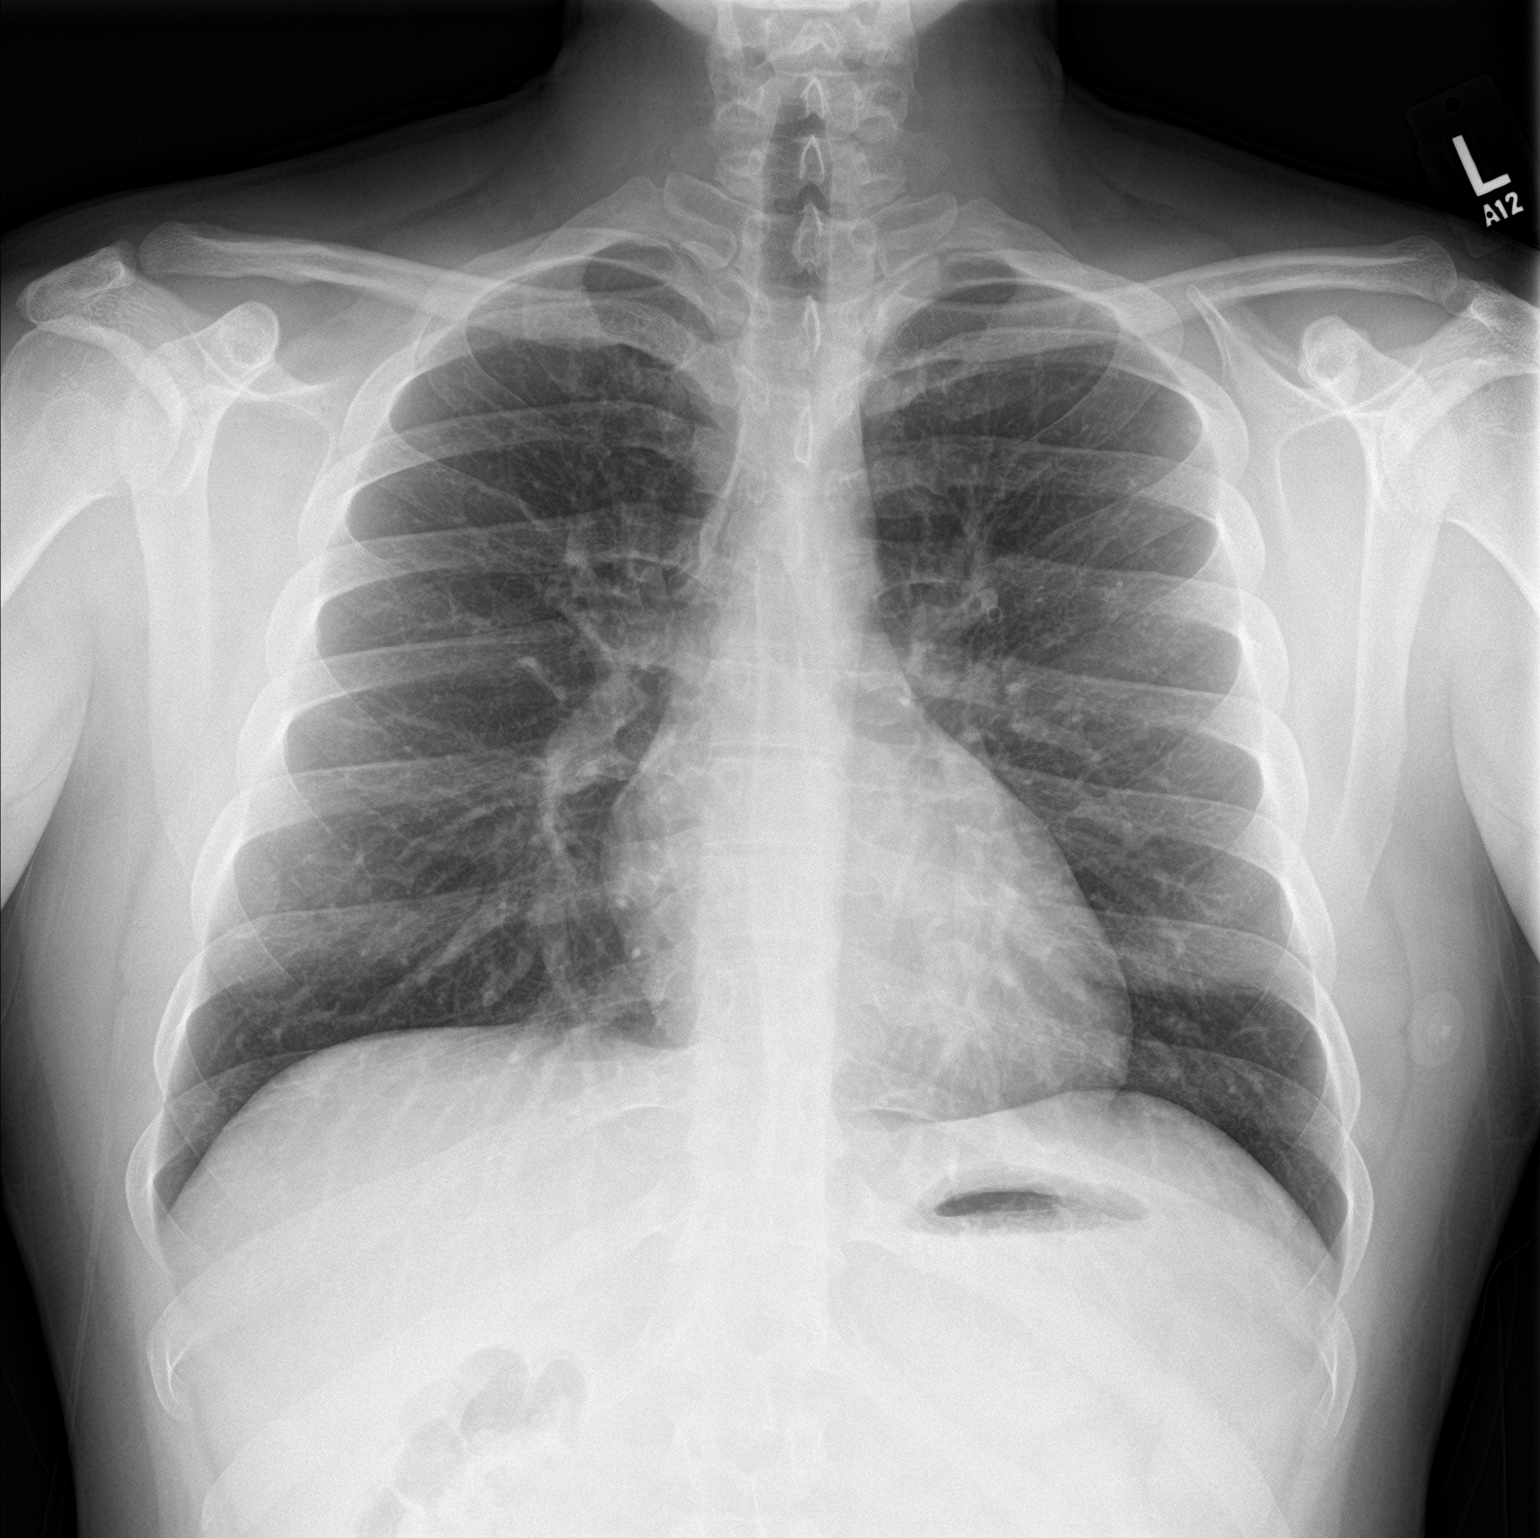

[chest lat]
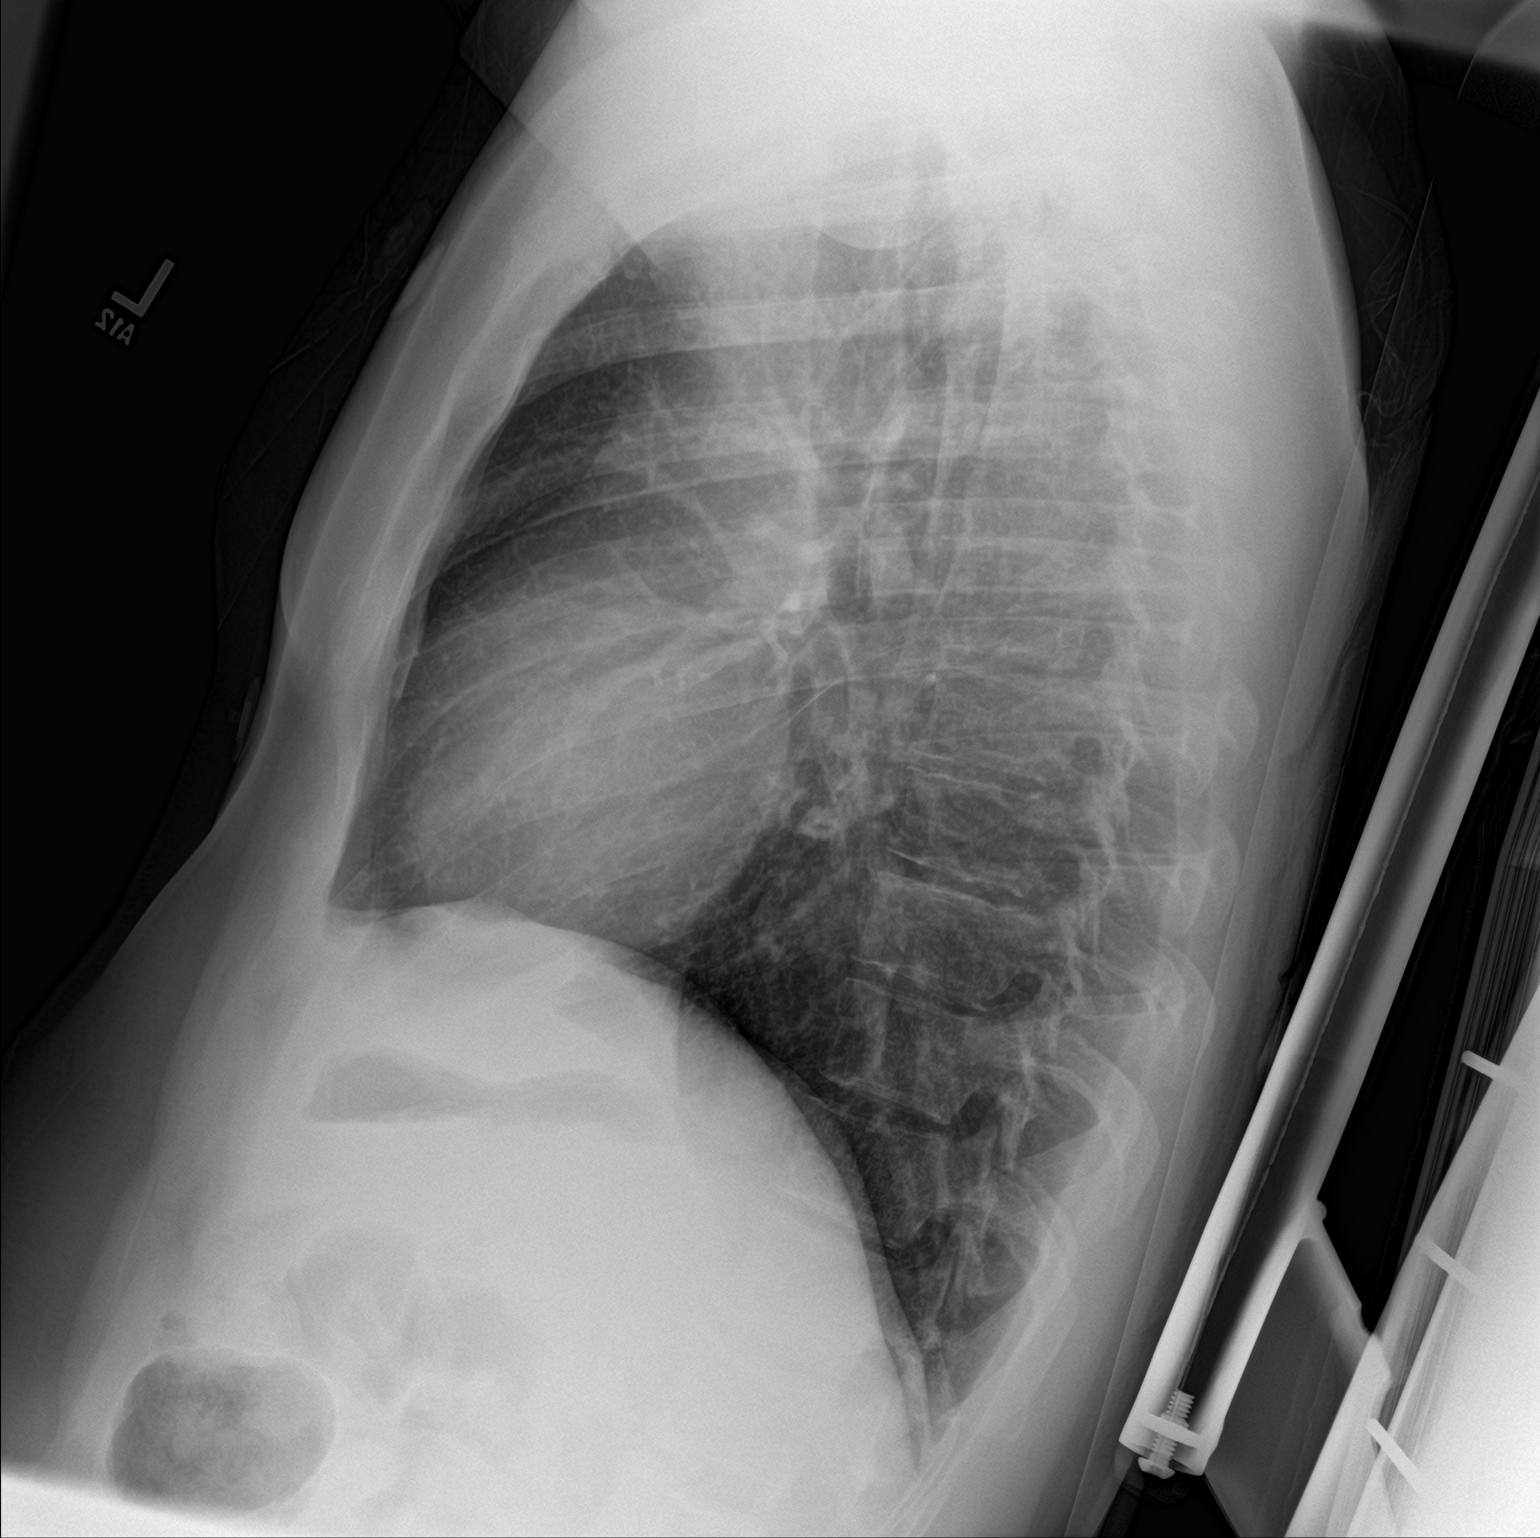

[2 of 2 positions shown; findings below may reference images not displayed]

FINDINGS: The heart size and mediastinal contours are within normal limits.
Both lungs are clear. The visualized skeletal structures are
unremarkable.
IMPRESSION: No active cardiopulmonary disease.

## 2017-12-15 ENCOUNTER — Ambulatory Visit
Admission: RE | Admit: 2017-12-15 | Discharge: 2017-12-15 | Disposition: A | Discharge status: Home / Self Care | Payer: Managed Care, Other (non HMO) | Source: Ambulatory Visit | Attending: Orthopedic Surgery | Admitting: Orthopedic Surgery

## 2017-12-15 ENCOUNTER — Other Ambulatory Visit: Payer: Self-pay | Admitting: Orthopedic Surgery

## 2017-12-15 DIAGNOSIS — S62024A Nondisplaced fracture of middle third of navicular [scaphoid] bone of right wrist, initial encounter for closed fracture: Secondary | ICD-10-CM

## 2019-01-30 ENCOUNTER — Ambulatory Visit: Payer: Managed Care, Other (non HMO) | Admitting: Family Medicine
